# Patient Record
Sex: Female | Born: 1990 | Marital: Single
Health system: Southern US, Community
[De-identification: ages and names within clinical notes are randomized; demographics above are authoritative.]

## PROBLEM LIST (undated history)

## (undated) HISTORY — PX: HERNIA REPAIR: SHX51

## (undated) HISTORY — PX: CHOLECYSTECTOMY: SHX55

## (undated) HISTORY — PX: CYST EXCISION: SHX5701

---

## 2013-03-06 DIAGNOSIS — R002 Palpitations: Secondary | ICD-10-CM

## 2018-10-30 ENCOUNTER — Other Ambulatory Visit (HOSPITAL_COMMUNITY): Payer: Self-pay | Admitting: General Surgery

## 2018-10-30 ENCOUNTER — Other Ambulatory Visit: Payer: Self-pay | Admitting: General Surgery

## 2018-11-08 ENCOUNTER — Encounter: Payer: Self-pay | Admitting: Skilled Nursing Facility1

## 2018-11-08 ENCOUNTER — Other Ambulatory Visit: Payer: Self-pay

## 2018-11-08 ENCOUNTER — Encounter: Payer: Medicare Other | Attending: General Surgery | Admitting: Skilled Nursing Facility1

## 2018-11-08 DIAGNOSIS — G4730 Sleep apnea, unspecified: Secondary | ICD-10-CM | POA: Diagnosis not present

## 2018-11-08 DIAGNOSIS — E669 Obesity, unspecified: Secondary | ICD-10-CM

## 2018-11-08 DIAGNOSIS — Z6841 Body Mass Index (BMI) 40.0 and over, adult: Secondary | ICD-10-CM | POA: Insufficient documentation

## 2018-11-08 NOTE — Progress Notes (Signed)
Pre-Op Assessment Visit:  Pre-Operative Sleeve Gastrectomy Surgery  Medical Nutrition Therapy:  Appt start time: 2:24  End time:  3:30  Patient was seen on 11/08/2018 for Pre-Operative Nutrition Assessment. [facility] 11/08/2018.    Referral Stated [facility] Appointments Required: 6 to be done with her PCP  Proposed Surgery Type: Sleeve Gastrectomy   Pt expectation of surgery: to be healthy   Pt expectation of dietitian: to help make changes    NUTRITION ASSESSMENT   Anthropometrics  Start weight at [facility]: 270.3 lbs Today's weight: 270.3 lbs BMI: 52.79 kg/m2     Psychosocial/Lifestyle  Pt states she does not want to focus on weight because it does not help motivate. Pt states her fraternal twin has been diagnosed with type 2 diabetes. Pt states he thinks she smokes out of boredom. Pt states she doe seat after having gone to bed. Pt states she has sleep apnea and does wear her C-PAP. Pt states she does have depression and anxiety. Pt states her doctor wants her to start checking her blood sugar.  Pt was seemingly honest with dietitian open to making changes.   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    NUTRITION INTERVENTION  Nutrition  counseling (C-1) and education (E-2) to facilitate bariatric surgery goals.   Handouts given during visit include:  . Pre-Op Goals . Bariatric Surgery Protein Shakes . Vitamin and Mineral Options    During the appointment today the following Pre-Op Goals were reviewed with the patient: . Log your food and beverage via an app or pen and paper  . Make healthy food choices . Begin to limit portion sizes . Limited concentrated sugars and fried foods . Keep fat/sugar in the single digits per serving on              food labels . Practice CHEWING your food  (aim for 30 chews per bite or until applesauce consistency) . Practice not drinking 15 minutes before, during, and 30 minutes after each meal/snack . Avoid all carbonated beverages  . Avoid/limit caffeinated beverages  . Avoid all sugar-sweetened beverages . Consume 3 meals per day; eat every 3-5 hours . Make a list of non-food related activities . Aim for 64-100 ounces of FLUID daily  . Aim for at least 60-80 grams of PROTEIN daily . Look for a liquid protein source that contain ?15 g protein and ?5 g carbohydrate  (ex: shakes, drinks, shots) -Always bring your meter with you everywhere you go -Always Properly dispose of your needles:  -Discard in a hard plastic/metal container with a lid (something the needle can't puncture)  -Write Do Not Recycle on the outside of the container  -Example: A laundry detergent bottle -Never use the same needle more than once -Eat 2-3 carbohydrate choices for each meal and 1 for each snack -  A meal: carbohydrates, protein, vegetable -A snack: A Fruit OR Vegetable AND Protein  -Try to be more active -Always pay attention to your body keeping watchful of possible low blood sugar (below 70) or high blood sugar (above 200)  -Start having a protein shake for breakfast   Change readiness: Ready  Demonstrated degree of understanding via: Teach Back      MONITORING & EVALUATION Dietary intake, weekly  physical activity, body weight, and pre-op goals reached.    Next Steps  Patient is to call [facility] (once surgery date is scheduled) to be scheduled for Pre-Op Class, which must be at least 2 weeks prior to surgery date or back follow up visit in 1 month

## 2018-11-08 NOTE — Patient Instructions (Addendum)
-  Always bring your meter with you everywhere you go -Always Properly dispose of your needles:  -Discard in a hard plastic/metal container with a lid (something the needle can't puncture)  -Write Do Not Recycle on the outside of the container  -Example: A laundry detergent bottle -Never use the same needle more than once -Eat 2-3 carbohydrate choices for each meal and 1 for each snack -A meal: carbohydrates, protein, vegetable -A snack: A Fruit OR Vegetable AND Protein  -Try to be more active -Always pay attention to your body keeping watchful of possible low blood sugar (below 70) or high blood sugar (above 200)  -Start having a protein shake for breakfast

## 2018-12-18 ENCOUNTER — Other Ambulatory Visit: Payer: Self-pay

## 2018-12-18 ENCOUNTER — Ambulatory Visit (HOSPITAL_COMMUNITY)
Admission: RE | Admit: 2018-12-18 | Discharge: 2018-12-18 | Disposition: A | Discharge status: Home / Self Care | Payer: Medicare Other | Source: Ambulatory Visit | Attending: General Surgery | Admitting: General Surgery

## 2018-12-18 ENCOUNTER — Other Ambulatory Visit (HOSPITAL_COMMUNITY): Payer: Self-pay | Admitting: General Surgery

## 2019-03-13 ENCOUNTER — Ambulatory Visit: Payer: Medicare Other | Admitting: Psychology

## 2019-03-14 ENCOUNTER — Ambulatory Visit: Payer: Medicare Other | Admitting: Psychology

## 2019-03-28 ENCOUNTER — Ambulatory Visit: Payer: Medicare Other | Admitting: Psychology

## 2019-04-03 ENCOUNTER — Ambulatory Visit: Payer: Medicare Other | Admitting: Psychology

## 2020-11-14 IMAGING — RF UPPER GI SERIES (WITHOUT KUB)
11 of 19 series · 12 of 24 positions shown · non-contrast
Comparison: None.

CLINICAL DATA: 28-year-old female with obesity undergoing workup
for bariatric surgery.

EXAM:
UPPER GI SERIES WITH KUB
TECHNIQUE: After obtaining a scout radiograph a routine upper GI series was
performed using barium
FLUOROSCOPY TIME:  Fluoroscopy Time:  1 minutes 42 seconds
Radiation Exposure Index (if provided by the fluoroscopic device):
100.6 mGy
Number of Acquired Spot Images: 0

[Series 2: t abdomen supine · 0.15mm/px · 1 of 1 slices shown]
[im 1/1]
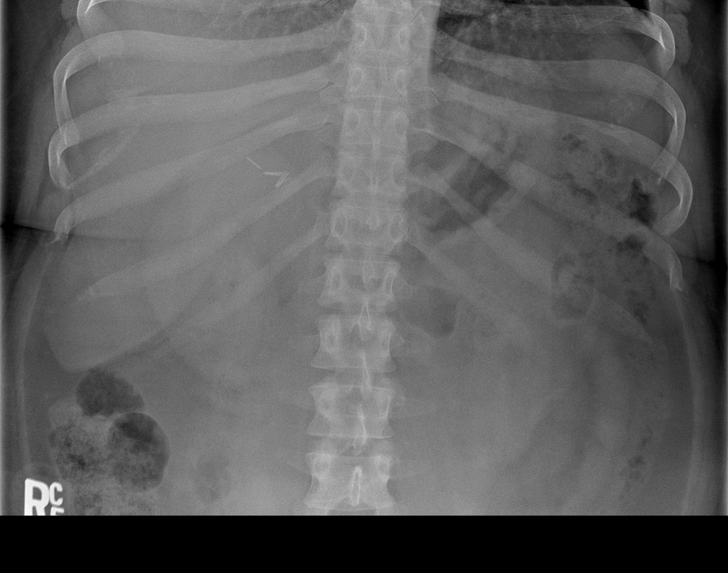

[Series 3: cp_standard · 0.52mm/px · 1 of 23 frames shown (1 of 10)]
[frame 20/23]
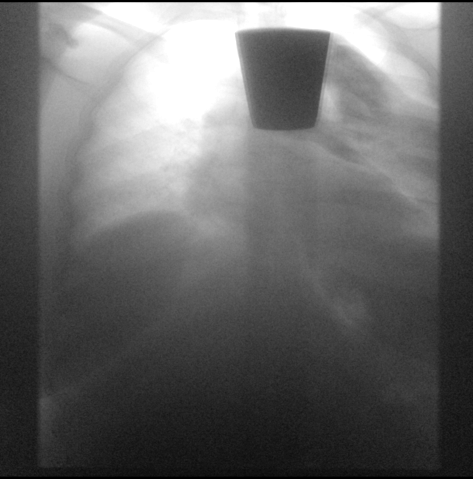

[Series 4: cp_standard · 0.52mm/px · 1 of 61 frames shown (2 of 10)]
[frame 31/61]
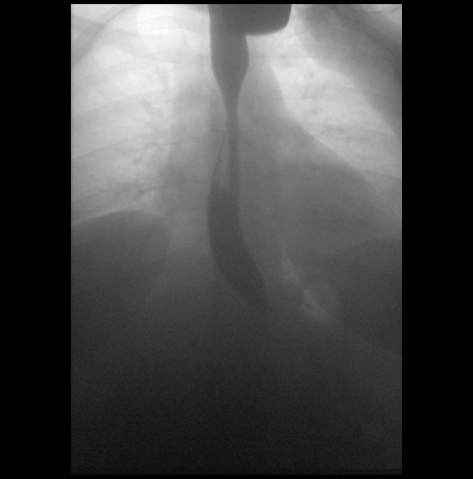

[Series 6: cp_standard · 0.35mm/px · 2 of 27 frames shown (3 of 10)]
[frame 1/27]
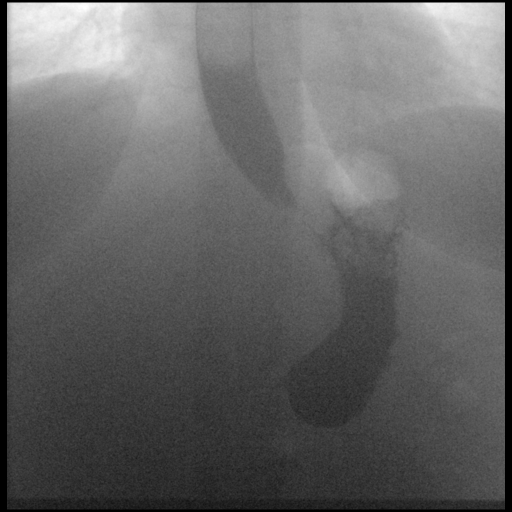
[frame 23/27]
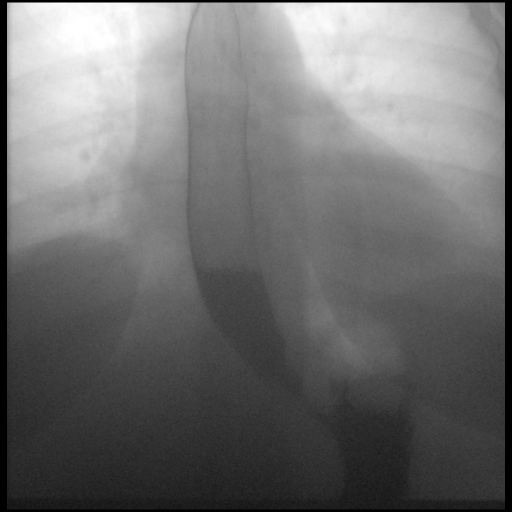

[Series 7: cp_standard · 0.35mm/px · 1 of 33 frames shown (4 of 10)]
[frame 17/33]
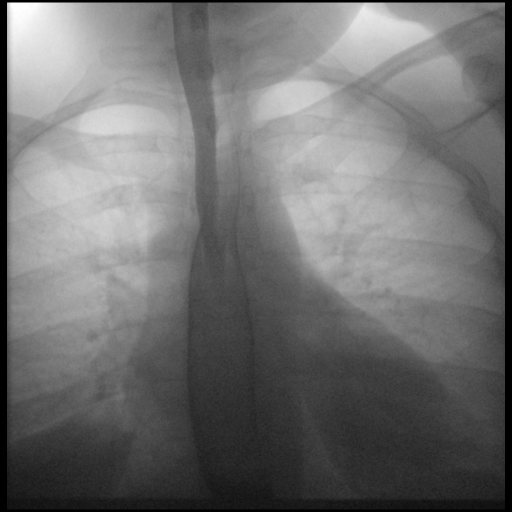

[Series 9: cp_standard · 0.17mm/px · 1 of 1 slices shown (5 of 10)]
[im 1/1]
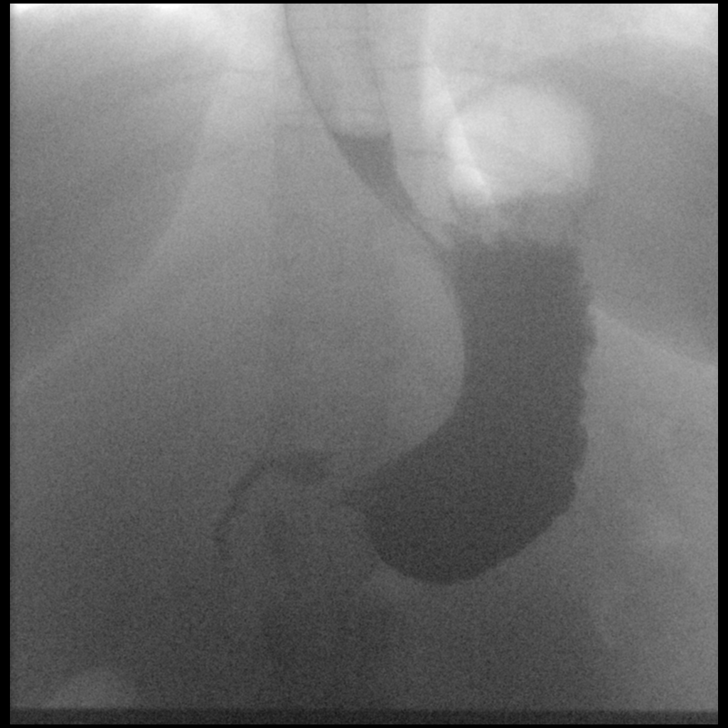

[Series 12: cp_standard · 0.17mm/px · 1 of 1 slices shown (6 of 10)]
[im 1/1]
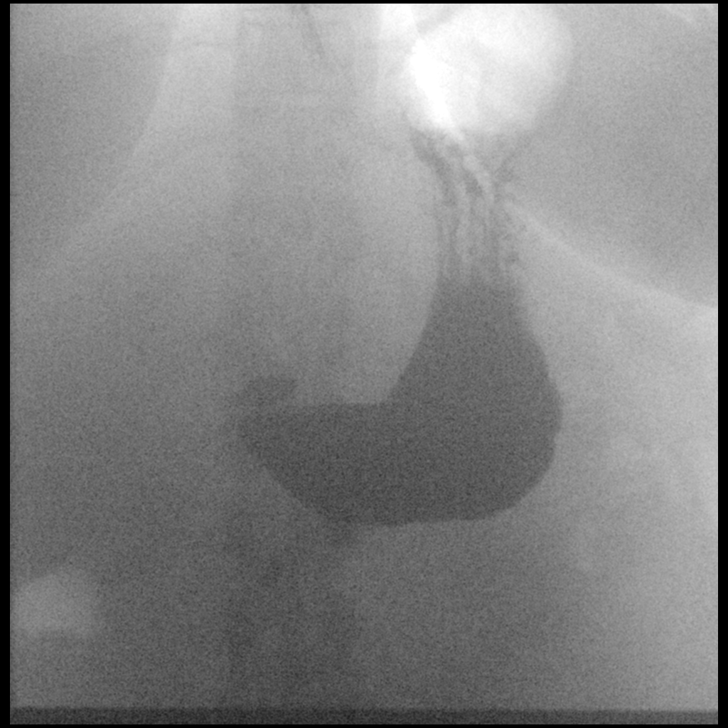

[Series 15: cp_standard · 0.17mm/px · 1 of 1 slices shown (7 of 10)]
[im 1/1]
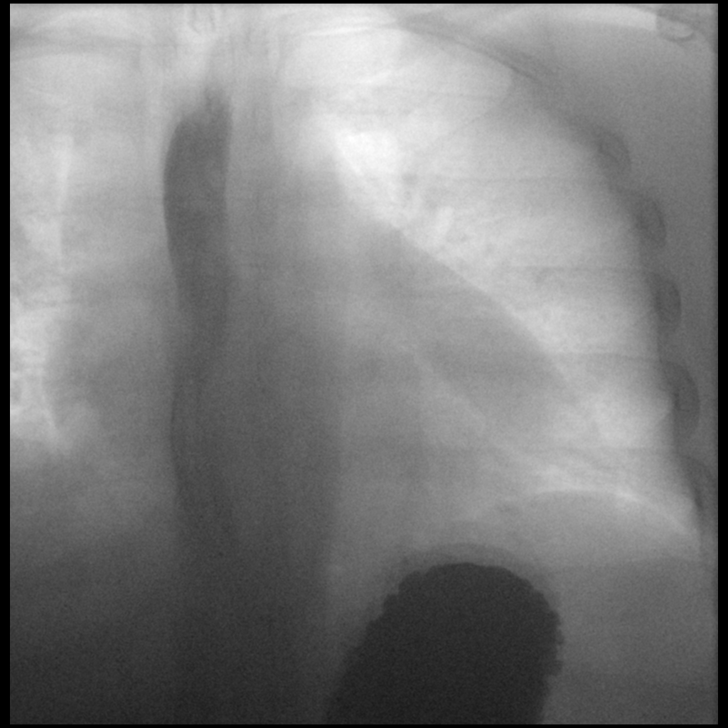

[Series 18: cp_standard · 0.17mm/px · 1 of 1 slices shown (8 of 10)]
[im 1/1]
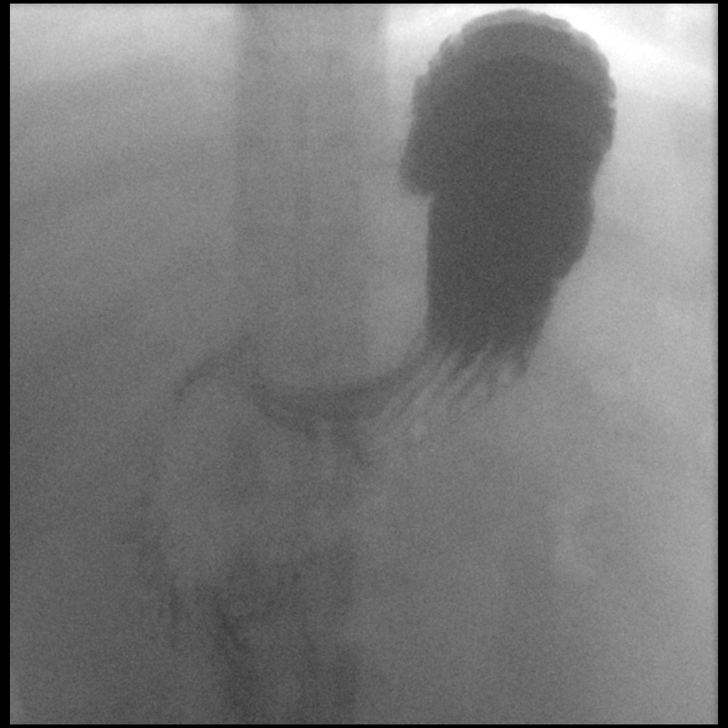

[Series 21: cp_standard · 0.17mm/px · 1 of 1 slices shown (9 of 10)]
[im 1/1]
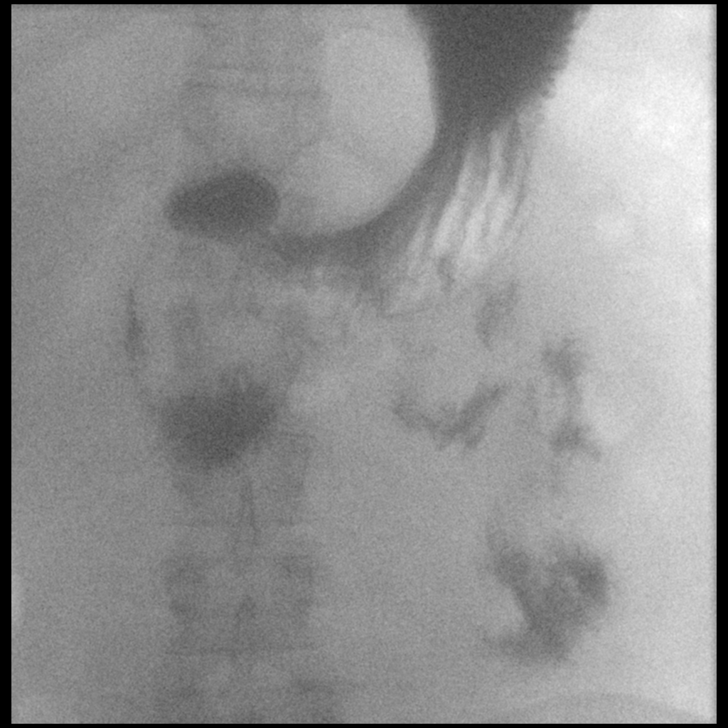

[Series 24: cp_standard · 0.17mm/px · 1 of 1 slices shown (10 of 10)]
[im 1/1]
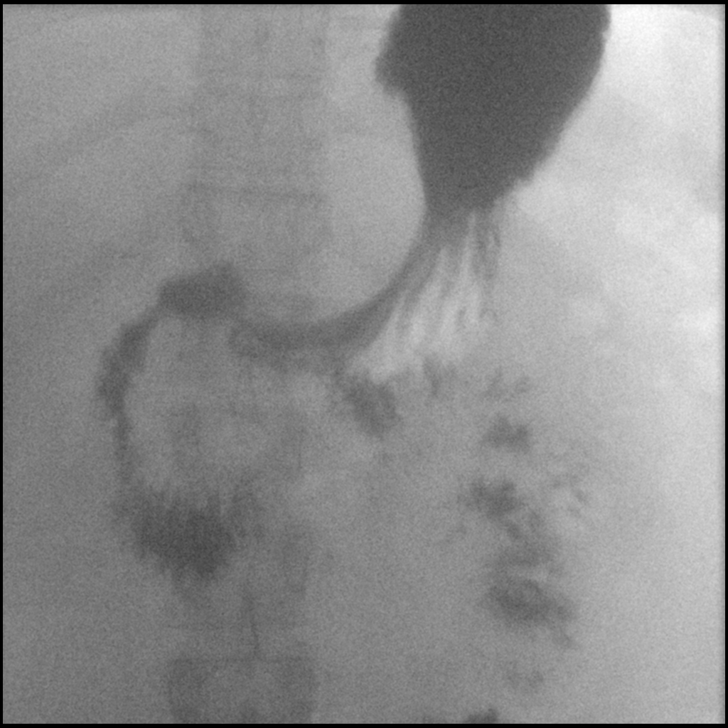

[12 of 24 positions shown; findings below may reference images not displayed]

FINDINGS: Preprocedural scout views of the abdomen and pelvis. Right upper
quadrant cholecystectomy clips. Normal bowel gas pattern. Normal
abdominal and pelvic visceral contours. No osseous abnormality
identified.

A single contrast study was undertaken and the patient tolerated
this well and without difficulty.

No obstruction to the forward flow of contrast throughout the
esophagus and into the stomach. Normal esophageal course and
contour. Normal esophageal mucosal pattern.

Normal gastroesophageal junction. Normal gastric contour. Prompt
gastric emptying.

Normal configuration of the duodenum C-loop. Unremarkable opacified
proximal small bowel.

When the patient was placed supine spontaneous gastroesophageal
reflux occurred to the level of the thoracic inlet. The patient
stated she was aware of this, but otherwise asymptomatic.
IMPRESSION: 1. Spontaneous gastroesophageal reflux when supine.
2. Otherwise negative preoperative upper GI.

## 2021-09-17 ENCOUNTER — Other Ambulatory Visit: Payer: Self-pay | Admitting: Family Medicine

## 2021-09-17 DIAGNOSIS — R000 Tachycardia, unspecified: Secondary | ICD-10-CM
